# Patient Record
Sex: Male | Born: 1990 | Race: Black or African American | Hispanic: No | Marital: Single | Smoking: Current every day smoker
Health system: Southern US, Community
[De-identification: ages and names within clinical notes are randomized; demographics above are authoritative.]

---

## 2012-07-01 ENCOUNTER — Encounter (HOSPITAL_COMMUNITY): Payer: Self-pay | Admitting: *Deleted

## 2012-07-01 ENCOUNTER — Emergency Department (HOSPITAL_COMMUNITY)
Admission: EM | Admit: 2012-07-01 | Discharge: 2012-07-02 | Disposition: A | Discharge status: Home / Self Care | Payer: Self-pay | Attending: Emergency Medicine | Admitting: Emergency Medicine

## 2012-07-01 DIAGNOSIS — R6883 Chills (without fever): Secondary | ICD-10-CM | POA: Insufficient documentation

## 2012-07-01 DIAGNOSIS — F17200 Nicotine dependence, unspecified, uncomplicated: Secondary | ICD-10-CM | POA: Insufficient documentation

## 2012-07-01 DIAGNOSIS — K2970 Gastritis, unspecified, without bleeding: Secondary | ICD-10-CM | POA: Insufficient documentation

## 2012-07-01 DIAGNOSIS — R197 Diarrhea, unspecified: Secondary | ICD-10-CM | POA: Insufficient documentation

## 2012-07-01 DIAGNOSIS — R110 Nausea: Secondary | ICD-10-CM | POA: Insufficient documentation

## 2012-07-01 MED ORDER — ONDANSETRON 8 MG PO TBDP: 8.0000 mg | ORAL_TABLET | Freq: Once | ORAL | Status: DC

## 2012-07-01 MED ORDER — ONDANSETRON 8 MG PO TBDP: ORAL_TABLET | ORAL | Status: DC

## 2012-07-01 MED ORDER — ONDANSETRON HCL 4 MG/2ML IJ SOLN
4.0000 mg | Freq: Once | INTRAMUSCULAR | Status: AC
  Administered 2012-07-01: 4 mg via INTRAVENOUS
  Filled 2012-07-01: qty 2

## 2012-07-01 MED ORDER — SODIUM CHLORIDE 0.9 % IV BOLUS (SEPSIS)
1000.0000 mL | Freq: Once | INTRAVENOUS | Status: AC
  Administered 2012-07-01: 1000 mL via INTRAVENOUS

## 2012-07-01 NOTE — ED Notes (Signed)
Gave pt of fluid to drink.

## 2012-07-01 NOTE — ED Notes (Signed)
Per pt and friend - pt was drinking ETOH yesterday evening "was drunk but didn't drink that much" - pt also ate [employer] food last night. Pt awoke this a.m. Not feeling well - pt's friend gave him one of her rx flagyl and ibuprofen. Pt has continued to have generalized, intermittent abd pain, denies n/v however has had multiple soft stools. Pt attempted to drink a beer tonight and was unable to d/t not feeling well.

## 2012-07-01 NOTE — ED Provider Notes (Signed)
History     CSN: [id number]  Arrival date & time 07/01/12  2029   First MD Initiated Contact with Patient 07/01/12 2226      Chief Complaint  Patient presents with  . Abdominal Pain   HPI  History provided by the patient. Patient is a 21 year old male with no significant PMH who presents with complaints of diarrhea and abdominal discomfort. Patient states that he was out last night drinking with friends. He woke up this morning not feeling well with slight nausea and developed episodes of soft loose diarrhea without blood or mucus. Since that time he has had waxing waning abdominal discomfort and continued nausea. Patient has tolerated some fluids but has not eaten any solid foods today. He also took some ibuprofen and antibiotic from his friend without significant change in symptoms. He denies any fever but reports some chills. He has no recent travel. Denies any known sick contacts.    History reviewed. No pertinent past medical history.  No past surgical history on file.  No family history on file.  History  Substance Use Topics  . Smoking status: Current Every Day Smoker -- 0.5 packs/day    Types: Cigarettes  . Smokeless tobacco: Not on file  . Alcohol Use: Yes     Comment: drinks every weekend      Review of Systems  Constitutional: Positive for chills. Negative for fever.  HENT: Negative for congestion and rhinorrhea.   Gastrointestinal: Positive for nausea, abdominal pain and diarrhea. Negative for vomiting and constipation.  Genitourinary: Negative for dysuria, frequency, hematuria and flank pain.  Skin: Negative for rash.    Allergies  Review of patient's allergies indicates no known allergies.  Home Medications   Current Outpatient Rx  Name  Route  Sig  Dispense  Refill  . IBUPROFEN 200 MG PO TABS   Oral   Take 200 mg by mouth every 6 (six) hours as needed. For pain         . METRONIDAZOLE 500 MG PO TABS   Oral   Take 500 mg by mouth once.             BP 133/72  Pulse 60  Temp 99 F (37.2 C) (Oral)  Resp 20  SpO2 99%  Physical Exam  Nursing note and vitals reviewed. Constitutional: He is oriented to person, place, and time. He appears well-developed and well-nourished. No distress.  HENT:  Head: Normocephalic.  Mouth/Throat: Oropharynx is clear and moist.  Cardiovascular: Normal rate and regular rhythm.   Pulmonary/Chest: Effort normal and breath sounds normal. No respiratory distress. He has no wheezes. He has no rales.  Abdominal: Soft. He exhibits no distension. There is no rebound, no guarding, no CVA tenderness, no tenderness at McBurney's point and negative Murphy's sign.       Mild diffuse tenderness  Neurological: He is alert and oriented to person, place, and time.  Skin: Skin is warm and dry. No rash noted.  Psychiatric: He has a normal mood and affect. His behavior is normal.    ED Course  Procedures      1. Gastritis   2. Diarrhea       MDM  Seen and evaluated. Patient does not appear acutely ill or toxic. He is resting comfortably in bed. She has mild diffuse discomfort of the abdomen without peritoneal signs.   She reports feeling better after IV fluids and Zofran. He has tolerated by mouth fluids. At this time we'll discharge home.  Angus Seller, [place] 07/02/12 [phone]

## 2012-07-02 NOTE — ED Notes (Signed)
Patient tolerated PO intake without any nausea or vomiting.

## 2012-07-03 NOTE — ED Provider Notes (Signed)
Medical screening examination/treatment/procedure(s) were performed by non-physician practitioner and as supervising physician I was immediately available for consultation/collaboration.  Flint Melter, MD 07/03/12 [phone]

## 2013-05-01 ENCOUNTER — Encounter (HOSPITAL_COMMUNITY): Payer: Self-pay | Admitting: Emergency Medicine

## 2013-05-01 ENCOUNTER — Emergency Department (HOSPITAL_COMMUNITY)
Admission: EM | Admit: 2013-05-01 | Discharge: 2013-05-01 | Disposition: A | Discharge status: Home / Self Care | Payer: Self-pay | Attending: Emergency Medicine | Admitting: Emergency Medicine

## 2013-05-01 DIAGNOSIS — R05 Cough: Secondary | ICD-10-CM | POA: Insufficient documentation

## 2013-05-01 DIAGNOSIS — N342 Other urethritis: Secondary | ICD-10-CM | POA: Insufficient documentation

## 2013-05-01 DIAGNOSIS — F17200 Nicotine dependence, unspecified, uncomplicated: Secondary | ICD-10-CM | POA: Insufficient documentation

## 2013-05-01 DIAGNOSIS — R109 Unspecified abdominal pain: Secondary | ICD-10-CM | POA: Insufficient documentation

## 2013-05-01 DIAGNOSIS — R300 Dysuria: Secondary | ICD-10-CM | POA: Insufficient documentation

## 2013-05-01 DIAGNOSIS — R059 Cough, unspecified: Secondary | ICD-10-CM | POA: Insufficient documentation

## 2013-05-01 MED ORDER — AZITHROMYCIN 250 MG PO TABS
1000.0000 mg | ORAL_TABLET | Freq: Once | ORAL | Status: AC
  Administered 2013-05-01: 1000 mg via ORAL
  Filled 2013-05-01: qty 4

## 2013-05-01 MED ORDER — CEFTRIAXONE SODIUM 250 MG IJ SOLR
250.0000 mg | Freq: Once | INTRAMUSCULAR | Status: AC
  Administered 2013-05-01: 250 mg via INTRAMUSCULAR
  Filled 2013-05-01: qty 250

## 2013-05-01 NOTE — ED Notes (Signed)
Per pt, "was messing with a girl a couple of weeks ago" a few days later he woke up wigh abdominal pain and a "discharge"

## 2013-05-01 NOTE — ED Provider Notes (Signed)
Medical screening examination/treatment/procedure(s) were performed by non-physician practitioner and as supervising physician I was immediately available for consultation/collaboration.  Ethelda Chick, MD 05/01/13 1025

## 2013-05-01 NOTE — ED Provider Notes (Signed)
CSN: [id number]     Arrival date & time 05/01/13  6213 History   First MD Initiated Contact with Patient 05/01/13 [phone]     Chief Complaint  Patient presents with  . Exposure to STD   (Consider location/radiation/quality/duration/timing/severity/associated sxs/prior Treatment) HPI  Hunter Mcdonald is a 22 y.o. male complaining of complaining of lower abdominal discomfort starting 2 days ago associated with a cough white urethral discharge and dysuria starting yesterday. Patient denies rash, lesion, testicular pain or swelling, fever, nausea vomiting, change in bowel or bladder habits, joint pain. Patient has unprotected sex 2 weeks ago.   History reviewed. No pertinent past medical history. History reviewed. No pertinent past surgical history. No family history on file. History  Substance Use Topics  . Smoking status: Current Every Day Smoker -- 0.50 packs/day    Types: Cigarettes  . Smokeless tobacco: Not on file  . Alcohol Use: Yes     Comment: drinks every weekend    Review of Systems 10 systems reviewed and found to be negative, except as noted in the HPI  Allergies  Review of patient's allergies indicates no known allergies.  Home Medications   Current Outpatient Rx  Name  Route  Sig  Dispense  Refill  . ibuprofen (ADVIL,MOTRIN) 200 MG tablet   Oral   Take 200 mg by mouth every 6 (six) hours as needed. For pain         . metroNIDAZOLE (FLAGYL) 500 MG tablet   Oral   Take 500 mg by mouth once.         . ondansetron (ZOFRAN ODT) 8 MG disintegrating tablet      8mg  ODT q4 hours prn nausea   10 tablet   0    BP 134/73  Pulse 63  Temp(Src) 98.7 F (37.1 C) (Oral)  Resp 16  SpO2 100% Physical Exam  Nursing note and vitals reviewed. Constitutional: He is oriented to person, place, and time. He appears well-developed and well-nourished. No distress.  HENT:  Head: Normocephalic.  Eyes: Conjunctivae and EOM are normal.  Cardiovascular: Normal rate.    Pulmonary/Chest: Effort normal. No stridor.  Abdominal: Soft. There is no tenderness.  Genitourinary: Testes normal and penis normal. Right testis shows no swelling and no tenderness. Left testis shows no swelling and no tenderness. Circumcised. No penile erythema or penile tenderness. No discharge found.  No rashes or lesions  Musculoskeletal: Normal range of motion.  Lymphadenopathy:       Right: No inguinal adenopathy present.       Left: No inguinal adenopathy present.  Neurological: He is alert and oriented to person, place, and time.  Psychiatric: He has a normal mood and affect.    ED Course  Procedures (including critical care time) Labs Review Labs Reviewed - No data to display Imaging Review No results found.  MDM   1. Urethritis    Filed Vitals:   05/01/13 0901  BP: 134/73  Pulse: 63  Temp: 98.7 F (37.1 C)  TempSrc: Oral  Resp: 16  SpO2: 100%     Hunter Mcdonald is a 22 y.o. male with urethritis after unprotected sex several weeks ago. No systemic signs, abdominal exam is benign, testicular exam is normal and there were no rashes or lesions. I will treat the patient for gonorrhea and Chlamydia, and I have offered to test him for HIV and syphilis but he has refused. Counseled the patient on importance of full STD screening.  Medications  azithromycin (ZITHROMAX) tablet 1,000  mg (not administered)  cefTRIAXone (ROCEPHIN) injection 250 mg (not administered)    Pt is hemodynamically stable, appropriate for, and amenable to discharge at this time. Pt verbalized understanding and agrees with care plan. All questions answered. Outpatient follow-up and specific return precautions discussed.    Note: Portions of this report may have been transcribed using voice recognition software. Every effort was made to ensure accuracy; however, inadvertent computerized transcription errors may be present      Wynetta Emery, PA-C 05/01/13 1022

## 2013-05-01 NOTE — ED Notes (Signed)
Our PA, Joni Reining, is evaluating him as I write this.  She obtains urethral swab also. Pt. Is in no distress.

## 2013-05-03 LAB — GC/CHLAMYDIA PROBE AMP
CT Probe RNA: POSITIVE — AB
GC Probe RNA: NEGATIVE

## 2013-05-05 NOTE — ED Notes (Signed)
+   Chlamydia Patient treated with Rocephin And Zithromax-[facility] faxed 

## 2013-08-17 ENCOUNTER — Encounter (HOSPITAL_COMMUNITY): Payer: Self-pay | Admitting: Emergency Medicine

## 2013-08-17 ENCOUNTER — Emergency Department (HOSPITAL_COMMUNITY)
Admission: EM | Admit: 2013-08-17 | Discharge: 2013-08-17 | Disposition: A | Discharge status: Home / Self Care | Payer: Self-pay | Attending: Emergency Medicine | Admitting: Emergency Medicine

## 2013-08-17 DIAGNOSIS — B009 Herpesviral infection, unspecified: Secondary | ICD-10-CM | POA: Insufficient documentation

## 2013-08-17 DIAGNOSIS — N342 Other urethritis: Secondary | ICD-10-CM | POA: Insufficient documentation

## 2013-08-17 DIAGNOSIS — A64 Unspecified sexually transmitted disease: Secondary | ICD-10-CM | POA: Insufficient documentation

## 2013-08-17 DIAGNOSIS — F17200 Nicotine dependence, unspecified, uncomplicated: Secondary | ICD-10-CM | POA: Insufficient documentation

## 2013-08-17 LAB — RPR: RPR Ser Ql: NONREACTIVE

## 2013-08-17 LAB — GC/CHLAMYDIA PROBE AMP
CT Probe RNA: NEGATIVE
GC PROBE AMP APTIMA: NEGATIVE

## 2013-08-17 MED ORDER — STERILE WATER FOR INJECTION IJ SOLN
INTRAMUSCULAR | Status: AC
  Filled 2013-08-17: qty 10

## 2013-08-17 MED ORDER — ACYCLOVIR 400 MG PO TABS: 800.0000 mg | ORAL_TABLET | Freq: Two times a day (BID) | ORAL | Status: DC

## 2013-08-17 MED ORDER — AZITHROMYCIN 250 MG PO TABS
1000.0000 mg | ORAL_TABLET | Freq: Once | ORAL | Status: AC
  Administered 2013-08-17: 1000 mg via ORAL
  Filled 2013-08-17: qty 4

## 2013-08-17 MED ORDER — CEFTRIAXONE SODIUM 250 MG IJ SOLR
250.0000 mg | Freq: Once | INTRAMUSCULAR | Status: AC
  Administered 2013-08-17: 250 mg via INTRAMUSCULAR
  Filled 2013-08-17: qty 250

## 2013-08-17 MED ORDER — ACYCLOVIR 200 MG PO CAPS
400.0000 mg | ORAL_CAPSULE | Freq: Once | ORAL | Status: AC
  Administered 2013-08-17: 400 mg via ORAL
  Filled 2013-08-17: qty 2

## 2013-08-17 NOTE — ED Notes (Signed)
Pt also asked for a herpes test, he has a small patch of bumps on the shaft of his penis

## 2013-08-17 NOTE — ED Provider Notes (Signed)
CSN: [id number]     Arrival date & time 08/17/13  0240 History   First MD Initiated Contact with Patient 08/17/13 0400     Chief Complaint  Patient presents with  . Penile Discharge   (Consider location/radiation/quality/duration/timing/severity/associated sxs/prior Treatment) HPI  This is a 23 year old male who presents with penile discharge. Patient reports 2 weeks of clear penile discharge. He also reports lesion over the right side of his penis. Patient has multiple sexual partners and has had new sexual partners as well. He has history of STDs but is unsure what kind. He denies any abdominal pain or groin swelling. He denies any other symptoms at this time including dysuria.  History reviewed. No pertinent past medical history. History reviewed. No pertinent past surgical history. History reviewed. No pertinent family history. History  Substance Use Topics  . Smoking status: Current Every Day Smoker -- 0.50 packs/day    Types: Cigarettes  . Smokeless tobacco: Not on file  . Alcohol Use: Yes     Comment: drinks every weekend    Review of Systems  Constitutional: Negative.  Negative for fever.  Respiratory: Negative.  Negative for shortness of breath.   Cardiovascular: Negative.  Negative for chest pain.  Gastrointestinal: Negative.  Negative for abdominal pain.  Genitourinary: Positive for discharge. Negative for dysuria.  Neurological: Negative for headaches.  All other systems reviewed and are negative.    Allergies  Review of patient's allergies indicates no known allergies.  Home Medications   Current Outpatient Rx  Name  Route  Sig  Dispense  Refill  . acyclovir (ZOVIRAX) 400 MG tablet   Oral   Take 2 tablets (800 mg total) by mouth 2 (two) times daily.   20 tablet   0    BP 115/74  Pulse 66  Temp(Src) 98.2 F (36.8 C) (Oral)  Resp 20  Ht 5\' 8"  (1.727 m)  Wt 170 lb (77.111 kg)  BMI 25.85 kg/m2  SpO2 98% Physical Exam  Nursing note and vitals  reviewed. Constitutional: He is oriented to person, place, and time. He appears well-developed and well-nourished. No distress.  HENT:  Head: Normocephalic and atraumatic.  Cardiovascular: Normal rate and regular rhythm.   Pulmonary/Chest: Effort normal. No respiratory distress.  Genitourinary: Penis normal. No penile tenderness.  Normal pallus, small less than 1 cm area of the right shaft with multiple punctate vesicles, no ulceration noted, no inguinal swelling, no spontaneous drainage noted from the urethra  Musculoskeletal: He exhibits no edema.  Lymphadenopathy:    He has no cervical adenopathy.  Neurological: He is alert and oriented to person, place, and time.  Skin: Skin is warm and dry.  Psychiatric: He has a normal mood and affect.    ED Course  Procedures (including critical care time) Labs Review Labs Reviewed  GC/CHLAMYDIA PROBE AMP  HERPES SIMPLEX VIRUS CULTURE  RPR   Imaging Review No results found.  EKG Interpretation   None       MDM   1. STD (sexually transmitted disease)   2. Herpes   3. Urethritis    Patient presents with concerns for penile discharge. He is nontoxic-appearing on exam. He has multiple risk factors for STDs. Exam is notable for vesicular lesion over the right side of the penile shaft. No other lesions noted. Lesion suspicious for herpes. Patient was swabbed for GC and Chlamydia as well as herpes. He will be given Rocephin, azithromycin, and acyclovir.  He will be discharged home with a five-day course of  acyclovir. He was instructed to avoid sexual contact for 10 days and inform partners of his empiric treatment for STDs. Patient stated understanding.  After history, exam, and medical workup I feel the patient has been appropriately medically screened and is safe for discharge home. Pertinent diagnoses were discussed with the patient. Patient was given return precautions.     Shon Batonourtney F Horton, MD 08/17/13 [phone]

## 2013-08-17 NOTE — Discharge Instructions (Signed)
Genital Herpes Genital herpes is a sexually transmitted disease. This means that it is a disease passed by having sex with an infected person. There is no cure for genital herpes. The time between attacks can be months to years. The virus may live in a person but produce no problems (symptoms). This infection can be passed to a baby as it travels down the birth canal (vagina). In a newborn, this can cause central nervous system damage, eye damage, or even death. The virus that causes genital herpes is usually HSV-2 virus. The virus that causes oral herpes is usually HSV-1. The diagnosis (learning what is wrong) is made through culture results. SYMPTOMS  Usually symptoms of pain and itching begin a few days to a week after contact. It first appears as small blisters that progress to small painful ulcers which then scab over and heal after several days. It affects the outer genitalia, birth canal, cervix, penis, anal area, buttocks, and thighs. HOME CARE INSTRUCTIONS   Keep ulcerated areas dry and clean.  Take medications as directed. Antiviral medications can speed up healing. They will not prevent recurrences or cure this infection. These medications can also be taken for suppression if there are frequent recurrences.  While the infection is active, it is contagious. Avoid all sexual contact during active infections.  Condoms may help prevent spread of the herpes virus.  Practice safe sex.  Wash your hands thoroughly after touching the genital area.  Avoid touching your eyes after touching your genital area.  Inform your caregiver if you have had genital herpes and become pregnant. It is your responsibility to insure a safe outcome for your baby in this pregnancy.  Only take over-the-counter or prescription medicines for pain, discomfort, or fever as directed by your caregiver. SEEK MEDICAL CARE IF:   You have a recurrence of this infection.  You do not respond to medications and are not  improving.  You have new sources of pain or discharge which have changed from the original infection.  You have an oral temperature above 102 F (38.9 C).  You develop abdominal pain.  You develop eye pain or signs of eye infection. Document Released: 07/25/2000 Document Revised: 10/20/2011 Document Reviewed: 08/15/2009 [facility] Patient Information 2014 [place], [place]. Urethritis, Adult Urethritis is an inflammation (soreness) of the urethra (the tube exiting from the bladder). It is often caused by germs that may be spread through sexual contact. TREATMENT  Urethritis will usually respond to antibiotics. These are medications that kill germs. Take all the medicine given to you. You may feel better in a couple days, but TAKE ALL MEDICINE or the infection may not be completely cured and may become more difficult to treat. Response can generally be expected in 7 to 10 days. You may require additional treatment after more testing. HOME CARE INSTRUCTIONS  Not have sex until the test results are known and treatment is completed.  Know that you may be asked to notify your sex partner when your final test results are back.  Finish all medications as prescribed.  Prevent sexually transmitted infections including AIDS. Practice safe sex. Use condoms. SEEK MEDICAL CARE IF:   Your symptoms are not improved in 2 to 3 days.  Your symptoms are getting worse.  Your develop abdominal pain.  You develop joint pain. SEEK IMMEDIATE MEDICAL CARE IF:   You have a fever.  You develop severe pain in the belly, back or side.  You develop repeated vomiting. TEST RESULTS Not all test results  are available during your visit. If your test results are not back during the visit, make an appointment with your caregiver to find out the results. Do not assume everything is normal if you have not heard from your caregiver or the medical facility. It is important for you to follow-up on all of your test  results. Document Released: 01/21/2001 Document Revised: 10/20/2011 Document Reviewed: 08/13/2009 [facility] Patient Information 2014 [place]

## 2013-08-17 NOTE — ED Notes (Signed)
Pt complains of penile discharge for two weeks, last had intercourse last week

## 2013-08-18 LAB — HERPES SIMPLEX VIRUS CULTURE: Culture: NOT DETECTED

## 2013-11-14 ENCOUNTER — Encounter (HOSPITAL_COMMUNITY): Payer: Self-pay | Admitting: Emergency Medicine

## 2013-11-14 ENCOUNTER — Emergency Department (HOSPITAL_COMMUNITY)
Admission: EM | Admit: 2013-11-14 | Discharge: 2013-11-14 | Disposition: A | Discharge status: Home / Self Care | Payer: Self-pay | Attending: Emergency Medicine | Admitting: Emergency Medicine

## 2013-11-14 DIAGNOSIS — N342 Other urethritis: Secondary | ICD-10-CM | POA: Insufficient documentation

## 2013-11-14 DIAGNOSIS — R300 Dysuria: Secondary | ICD-10-CM | POA: Insufficient documentation

## 2013-11-14 DIAGNOSIS — F17200 Nicotine dependence, unspecified, uncomplicated: Secondary | ICD-10-CM | POA: Insufficient documentation

## 2013-11-14 LAB — URINALYSIS, ROUTINE W REFLEX MICROSCOPIC
Bilirubin Urine: NEGATIVE
Glucose, UA: NEGATIVE mg/dL
HGB URINE DIPSTICK: NEGATIVE
Ketones, ur: NEGATIVE mg/dL
Nitrite: NEGATIVE
PROTEIN: NEGATIVE mg/dL
Specific Gravity, Urine: 1.011 (ref 1.005–1.030)
UROBILINOGEN UA: 0.2 mg/dL (ref 0.0–1.0)
pH: 5 (ref 5.0–8.0)

## 2013-11-14 LAB — URINE MICROSCOPIC-ADD ON

## 2013-11-14 MED ORDER — CEFTRIAXONE SODIUM 250 MG IJ SOLR
250.0000 mg | Freq: Once | INTRAMUSCULAR | Status: AC
  Administered 2013-11-14: 250 mg via INTRAMUSCULAR
  Filled 2013-11-14: qty 250

## 2013-11-14 MED ORDER — AZITHROMYCIN 250 MG PO TABS
1000.0000 mg | ORAL_TABLET | Freq: Once | ORAL | Status: AC
  Administered 2013-11-14: 1000 mg via ORAL
  Filled 2013-11-14: qty 4

## 2013-11-14 NOTE — ED Provider Notes (Signed)
CSN: [id number]     Arrival date & time 11/14/13  1225 History   This chart was scribed for non-physician practitioner Jeannetta Ellis, PA-C working with Richardean Canal, MD by Donne Anon, ED Scribe. This patient was seen in room WTR7/WTR7 and the patient's care was started at 1303.  First MD Initiated Contact with Patient 11/14/13 1303     Chief Complaint  Patient presents with  . Penile Discharge    The history is provided by the patient. No language interpreter was used.   HPI Comments: Erland Vivas is a 23 y.o. male who presents to the Emergency Department complaining of 1 week of gradual onset, gradually worsening penile discharge with associated mild dysuria. He states the discharge is a creamy, off white and greenish color. He has had unprotected sex with an unknown partner. He denies fever, chills, testicular swelling, increased frequency, nausea, vomiting or any other symptoms.  He currently smokes .5 pack of cigarettes/day.   History reviewed. No pertinent past medical history. History reviewed. No pertinent past surgical history. No family history on file. History  Substance Use Topics  . Smoking status: Current Every Day Smoker -- 0.50 packs/day    Types: Cigarettes  . Smokeless tobacco: Not on file  . Alcohol Use: Yes     Comment: drinks every weekend    Review of Systems  Constitutional: Negative for fever and chills.  Gastrointestinal: Negative for nausea and vomiting.  Genitourinary: Positive for dysuria and discharge. Negative for urgency, frequency, penile swelling, scrotal swelling, penile pain and testicular pain.  All other systems reviewed and are negative.      Allergies  Review of patient's allergies indicates no known allergies.  Home Medications   No current outpatient prescriptions on file. BP 130/66  Pulse 58  Temp(Src) 98.2 F (36.8 C) (Oral)  SpO2 100%  Physical Exam  Nursing note and vitals reviewed. Constitutional: He is  oriented to person, place, and time. He appears well-developed and well-nourished. No distress.  HENT:  Head: Normocephalic and atraumatic.  Right Ear: External ear normal.  Left Ear: External ear normal.  Nose: Nose normal.  Mouth/Throat: Oropharynx is clear and moist.  Eyes: Conjunctivae are normal.  Neck: Normal range of motion. Neck supple.  Cardiovascular: Normal rate, regular rhythm and normal heart sounds.   Pulmonary/Chest: Effort normal and breath sounds normal. No respiratory distress.  Abdominal: Soft.  Genitourinary: Testes normal and penis normal. Right testis shows no mass, no swelling and no tenderness. Right testis is descended. Left testis shows no mass, no swelling and no tenderness. Left testis is descended. Circumcised. No penile tenderness. No discharge found.  Musculoskeletal: Normal range of motion.  Neurological: He is alert and oriented to person, place, and time.  Skin: Skin is warm and dry. He is not diaphoretic.  Psychiatric: He has a normal mood and affect.    ED Course  Procedures (including critical care time) DIAGNOSTIC STUDIES: Oxygen Saturation is 100% on RA, normal by my interpretation.    COORDINATION OF CARE: 1:30 PM Discussed treatment plan which includes Rocephin 250 mg injection, Zithromax 1000 mg tablet, GC/Chlamydia probe amp, urine culture and urinalysis with pt at bedside and pt agreed to plan.    Labs Review Labs Reviewed  URINALYSIS, ROUTINE W REFLEX MICROSCOPIC - Abnormal; Notable for the following:    Leukocytes, UA SMALL (*)    All other components within normal limits  URINE CULTURE  GC/CHLAMYDIA PROBE AMP  URINE MICROSCOPIC-ADD ON   Imaging  Review No results found.   EKG Interpretation None      MDM   Final diagnoses:  Urethritis    Filed Vitals:   11/14/13 1240  BP: 130/66  Pulse: 58  Temp: 98.2 F (36.8 C)   Afebrile, NAD, non-toxic appearing, AAOx4.  Robyn HaberDevonte Rosevear is a 23 y.o. male with urethritis after  unprotected sex several weeks ago. No systemic signs, abdominal exam is benign, testicular exam is normal and there were no rashes or lesions. I will treat the patient for gonorrhea and Chlamydia. Counseled the patient on importance of full STD screening.  I personally performed the services described in this documentation, which was scribed in my presence. The recorded information has been reviewed and is accurate.     Jeannetta EllisJennifer L Marcio Hoque, PA-C 11/14/13 2017

## 2013-11-14 NOTE — Progress Notes (Signed)
[facility] provided pt with a list of primary care resources to help patient establish primary care.  °

## 2013-11-14 NOTE — Discharge Instructions (Signed)
Please follow up with your primary care physician in 1-2 days. If you do not have one please call the [facility] number listed above. Please read all discharge instructions and return precautions.    Sexually Transmitted Disease A sexually transmitted disease (STD) is a disease or infection that may be passed (transmitted) from person to person, usually during sexual activity. This may happen by way of saliva, semen, blood, vaginal mucus, or urine. Common STDs include:   Gonorrhea.   Chlamydia.   Syphilis.   HIV and AIDS.   Genital herpes.   Hepatitis B and C.   Trichomonas.   Human papillomavirus (HPV).   Pubic lice.   Scabies.  Mites.  Bacterial vaginosis. WHAT ARE CAUSES OF STDs? An STD may be caused by bacteria, a virus, or parasites. STDs are often transmitted during sexual activity if one person is infected. However, they may also be transmitted through nonsexual means. STDs may be transmitted after:   Sexual intercourse with an infected person.   Sharing sex toys with an infected person.   Sharing needles with an infected person or using unclean piercing or tattoo needles.  Having intimate contact with the genitals, mouth, or rectal areas of an infected person.   Exposure to infected fluids during birth. WHAT ARE THE SIGNS AND SYMPTOMS OF STDs? Different STDs have different symptoms. Some people may not have any symptoms. If symptoms are present, they may include:   Painful or bloody urination.   Pain in the pelvis, abdomen, vagina, anus, throat, or eyes.   Skin rash, itching, irritation, growths, sores (lesions), ulcerations, or warts in the genital or anal area.  Abnormal vaginal discharge with or without bad odor.   Penile discharge in men.   Fever.   Pain or bleeding during sexual intercourse.   Swollen glands in the groin area.   Yellow skin and eyes (jaundice). This is seen with hepatitis.   Swollen  testicles.  Infertility.  Sores and blisters in the mouth. HOW ARE STDs DIAGNOSED? To make a diagnosis, your health care provider may:   Take a medical history.   Perform a physical exam.   Take a sample of any discharge for examination.  Swab the throat, cervix, opening to the penis, rectum, or vagina for testing.  Test a sample of your first morning urine.   Perform blood tests.   Perform a Pap smear, if this applies.   Perform a colposcopy.   Perform a laparoscopy.  HOW ARE STDs TREATED? Treatment depends on the STD. Some STDs may be treated but not cured.   Chlamydia, gonorrhea, trichomonas, and syphilis can be cured with antibiotics.   Genital herpes, hepatitis, and HIV can be treated, but not cured, with prescribed medicines. The medicines lessen symptoms.   Genital warts from HPV can be treated with medicine or by freezing, burning (electrocautery), or surgery. Warts may come back.   HPV cannot be cured with medicine or surgery. However, abnormal areas may be removed from the cervix, vagina, or vulva.   If your diagnosis is confirmed, your recent sexual partners need treatment. This is true even if they are symptom-free or have a negative culture or evaluation. They should not have sex until their health care providers say it is OK. HOW CAN I REDUCE MY RISK OF GETTING AN STD?  Use latex condoms, dental dams, and water-soluble lubricants during sexual activity. Do not use petroleum jelly or oils.  Get vaccinated for HPV and hepatitis. If  you have not received these vaccines in the past, talk to your health care provider about whether one or both might be right for you.   Avoid risky sex practices that can break the skin.  WHAT SHOULD I DO IF I THINK I HAVE AN STD?  See your health care provider.   Inform all sexual partners. They should be tested and treated for any STDs.  Do not have sex until your health care provider says it is OK. WHEN  SHOULD I GET HELP? Seek immediate medical care if:  You develop severe abdominal pain.  You are a man and notice swelling or pain in the testicles.  You are a woman and notice swelling or pain in your vagina. Document Released: 10/18/2002 Document Revised: 05/18/2013 Document Reviewed: 02/15/2013 [facility] Patient Information 2014 [place], [place].  Urethritis, Adult Urethritis is an inflammation of the tube through which urine exits your bladder (urethra).  CAUSES Urethritis is often caused by an infection in your urethra. The infection can be viral, like herpes. The infection can also be bacterial, like gonorrhea. RISK FACTORS Risk factors of urethritis include:  Having sex without using a condom.  Having multiple sexual partners.  Having poor hygiene. SIGNS AND SYMPTOMS Symptoms of urethritis are less noticeable in women than in men. These symptoms include:  Burning feeling when you urinate (dysuria).  Discharge from your urethra.  Blood in your urine (hematuria).  Urinating more than usual. DIAGNOSIS  To confirm a diagnosis of urethritis, your health care provider will do the following:  Ask about your sexual history.  Perform a physical exam.  Have you provide a sample of your urine for lab testing.  Use a cotton swab to gently collect a sample from your urethra for lab testing. TREATMENT  It is important to treat urethritis. Depending on the cause, untreated urethritis may lead to serious genital infections and possibly infertility. Urethritis caused by a bacterial infection is treated with antibiotics. All sexual partners must be treated.  HOME CARE INSTRUCTIONS  Do not have sex until the test results are known and treatment is completed, even if your symptoms go away before you finish treatment.  Finish all medicines that you are prescribed. SEEK MEDICAL CARE IF:   Your symptoms are not improved in 3 days.  Your symptoms are getting worse.  You develop  abdominal pain or pelvic pain (in women).  You develop joint pain. SEEK IMMEDIATE MEDICAL CARE IF:   You have a fever with a temperature of 101.60F (38.8C) or greater.  You have severe pain in the belly, back, or side.  You have repeated vomiting. Document Released: 01/21/2001 Document Revised: 05/18/2013 Document Reviewed: 03/28/2013 [facility] Patient Information 2014 [place], [place].

## 2013-11-14 NOTE — ED Notes (Signed)
Pt c/o penile discharge x 1 week, states its a creamy, off white and greenish color. Has slight pain.

## 2013-11-15 LAB — URINE CULTURE
Colony Count: NO GROWTH
Culture: NO GROWTH

## 2013-11-15 LAB — GC/CHLAMYDIA PROBE AMP
CT Probe RNA: NEGATIVE
GC PROBE AMP APTIMA: NEGATIVE

## 2013-11-17 NOTE — ED Provider Notes (Signed)
Medical screening examination/treatment/procedure(s) were performed by non-physician practitioner and as supervising physician I was immediately available for consultation/collaboration.   EKG Interpretation None        Jazmon Kos H Syndi Pua, MD 11/17/13 0807 

## 2014-01-07 ENCOUNTER — Encounter (HOSPITAL_COMMUNITY): Payer: Self-pay | Admitting: Emergency Medicine

## 2014-01-07 ENCOUNTER — Emergency Department (HOSPITAL_COMMUNITY): Payer: No Typology Code available for payment source

## 2014-01-07 ENCOUNTER — Emergency Department (HOSPITAL_COMMUNITY)
Admission: EM | Admit: 2014-01-07 | Discharge: 2014-01-07 | Disposition: A | Discharge status: Home / Self Care | Payer: No Typology Code available for payment source | Attending: Emergency Medicine | Admitting: Emergency Medicine

## 2014-01-07 DIAGNOSIS — F17200 Nicotine dependence, unspecified, uncomplicated: Secondary | ICD-10-CM | POA: Insufficient documentation

## 2014-01-07 DIAGNOSIS — S0993XA Unspecified injury of face, initial encounter: Secondary | ICD-10-CM | POA: Insufficient documentation

## 2014-01-07 DIAGNOSIS — IMO0002 Reserved for concepts with insufficient information to code with codable children: Secondary | ICD-10-CM | POA: Insufficient documentation

## 2014-01-07 DIAGNOSIS — S199XXA Unspecified injury of neck, initial encounter: Secondary | ICD-10-CM

## 2014-01-07 DIAGNOSIS — Y929 Unspecified place or not applicable: Secondary | ICD-10-CM | POA: Insufficient documentation

## 2014-01-07 DIAGNOSIS — Y9389 Activity, other specified: Secondary | ICD-10-CM | POA: Insufficient documentation

## 2014-01-07 DIAGNOSIS — S51009A Unspecified open wound of unspecified elbow, initial encounter: Secondary | ICD-10-CM | POA: Insufficient documentation

## 2014-01-07 MED ORDER — IBUPROFEN 600 MG PO TABS: 600.0000 mg | ORAL_TABLET | Freq: Three times a day (TID) | ORAL | Status: AC
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
## 2014-01-07 NOTE — ED Notes (Signed)
Pt resting quietly with eyes closed at the time. No signs of distress noted.
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
## 2015-06-29 ENCOUNTER — Encounter (HOSPITAL_COMMUNITY): Payer: Self-pay

## 2015-06-29 ENCOUNTER — Emergency Department (HOSPITAL_COMMUNITY)
Admission: EM | Admit: 2015-06-29 | Discharge: 2015-06-29 | Disposition: A | Discharge status: Home / Self Care | Payer: Self-pay | Attending: Emergency Medicine | Admitting: Emergency Medicine

## 2015-06-29 DIAGNOSIS — R369 Urethral discharge, unspecified: Secondary | ICD-10-CM | POA: Insufficient documentation

## 2015-06-29 DIAGNOSIS — N489 Disorder of penis, unspecified: Secondary | ICD-10-CM

## 2015-06-29 DIAGNOSIS — F17210 Nicotine dependence, cigarettes, uncomplicated: Secondary | ICD-10-CM | POA: Insufficient documentation

## 2015-06-29 DIAGNOSIS — N5089 Other specified disorders of the male genital organs: Secondary | ICD-10-CM | POA: Insufficient documentation

## 2015-06-29 MED ORDER — PENICILLIN G BENZATHINE 1200000 UNIT/2ML IM SUSP
1.2000 10*6.[IU] | Freq: Once | INTRAMUSCULAR | Status: AC
  Administered 2015-06-29: 1.2 10*6.[IU] via INTRAMUSCULAR
  Filled 2015-06-29: qty 2

## 2015-06-29 NOTE — ED Notes (Signed)
Bed: WA27 Expected date:  Expected time:  Means of arrival:  Comments: 

## 2015-06-29 NOTE — ED Notes (Signed)
Pt evaluated by PA. Specimen sent. Pt waiting for his RPR and then will be for discharge. Pt denies any penile discharge. No rash on the palms or feet.

## 2015-06-29 NOTE — ED Notes (Signed)
Pt presents for a STD check, due to "bumps" on penis x 2.5 weeks.  Pain score 4/10.  Denies dysuria and penile discharge.  Pt reports new sexual partner prior to "bumps" appearing.

## 2015-06-29 NOTE — Discharge Instructions (Signed)
Sexually Transmitted Disease °A sexually transmitted disease (STD) is a disease or infection that may be passed (transmitted) from person to person, usually during sexual activity. This may happen by way of saliva, semen, blood, vaginal mucus, or urine. Common STDs include: °· Gonorrhea. °· Chlamydia. °· Syphilis. °· HIV and AIDS. °· Genital herpes. °· Hepatitis B and C. °· Trichomonas. °· Human papillomavirus (HPV). °· Pubic lice. °· Scabies. °· Mites. °· Bacterial vaginosis. °WHAT ARE CAUSES OF STDs? °An STD may be caused by bacteria, a virus, or parasites. STDs are often transmitted during sexual activity if one person is infected. However, they may also be transmitted through nonsexual means. STDs may be transmitted after:  °· Sexual intercourse with an infected person. °· Sharing sex toys with an infected person. °· Sharing needles with an infected person or using unclean piercing or tattoo needles. °· Having intimate contact with the genitals, mouth, or rectal areas of an infected person. °· Exposure to infected fluids during birth. °WHAT ARE THE SIGNS AND SYMPTOMS OF STDs? °Different STDs have different symptoms. Some people may not have any symptoms. If symptoms are present, they may include: °· Painful or bloody urination. °· Pain in the pelvis, abdomen, vagina, anus, throat, or eyes. °· A skin rash, itching, or irritation. °· Growths, ulcerations, blisters, or sores in the genital and anal areas. °· Abnormal vaginal discharge with or without bad odor. °· Penile discharge in men. °· Fever. °· Pain or bleeding during sexual intercourse. °· Swollen glands in the groin area. °· Yellow skin and eyes (jaundice). This is seen with hepatitis. °· Swollen testicles. °· Infertility. °· Sores and blisters in the mouth. °HOW ARE STDs DIAGNOSED? °To make a diagnosis, your health care provider may: °· Take a medical history. °· Perform a physical exam. °· Take a sample of any discharge to examine. °· Swab the throat,  cervix, opening to the penis, rectum, or vagina for testing. °· Test a sample of your first morning urine. °· Perform blood tests. °· Perform a Pap test, if this applies. °· Perform a colposcopy. °· Perform a laparoscopy. °HOW ARE STDs TREATED? °Treatment depends on the STD. Some STDs may be treated but not cured. °· Chlamydia, gonorrhea, trichomonas, and syphilis can be cured with antibiotic medicine. °· Genital herpes, hepatitis, and HIV can be treated, but not cured, with prescribed medicines. The medicines lessen symptoms. °· Genital warts from HPV can be treated with medicine or by freezing, burning (electrocautery), or surgery. Warts may come back. °· HPV cannot be cured with medicine or surgery. However, abnormal areas may be removed from the cervix, vagina, or vulva. °· If your diagnosis is confirmed, your recent sexual partners need treatment. This is true even if they are symptom-free or have a negative culture or evaluation. They should not have sex until their health care providers say it is okay. °· Your health care provider may test you for infection again 3 months after treatment. °HOW CAN I REDUCE MY RISK OF GETTING AN STD? °Take these steps to reduce your risk of getting an STD: °· Use latex condoms, dental dams, and water-soluble lubricants during sexual activity. Do not use petroleum jelly or oils. °· Avoid having multiple sex partners. °· Do not have sex with someone who has other sex partners °· Do not have sex with anyone you do not know or who is at high risk for an STD. °· Avoid risky sex practices that can break your skin. °· Do not have sex   if you have open sores on your mouth or skin. °· Avoid drinking too much alcohol or taking illegal drugs. Alcohol and drugs can affect your judgment and put you in a vulnerable position. °· Avoid engaging in oral and anal sex acts. °· Get vaccinated for HPV and hepatitis. If you have not received these vaccines in the past, talk to your health care  provider about whether one or both might be right for you. °· If you are at risk of being infected with HIV, it is recommended that you take a prescription medicine daily to prevent HIV infection. This is called pre-exposure prophylaxis (PrEP). You are considered at risk if: °¨ You are a man who has sex with other men (MSM). °¨ You are a heterosexual man or woman and are sexually active with more than one partner. °¨ You take drugs by injection. °¨ You are sexually active with a partner who has HIV. °· Talk with your health care provider about whether you are at high risk of being infected with HIV. If you choose to begin PrEP, you should first be tested for HIV. You should then be tested every 3 months for as long as you are taking PrEP. °WHAT SHOULD I DO IF I THINK I HAVE AN STD? °· See your health care provider. °· Tell your sexual partner(s). They should be tested and treated for any STDs. °· Do not have sex until your health care provider says it is okay. °WHEN SHOULD I GET IMMEDIATE MEDICAL CARE? °Contact your health care provider right away if:  °· You have severe abdominal pain. °· You are a man and notice swelling or pain in your testicles. °· You are a woman and notice swelling or pain in your vagina. °  °This information is not intended to replace advice given to you by your health care provider. Make sure you discuss any questions you have with your health care provider. °  °Document Released: 10/18/2002 Document Revised: 08/18/2014 Document Reviewed: 02/15/2013 °Elsevier Interactive Patient Education ©2016 [employer] ° °

## 2015-06-29 NOTE — ED Provider Notes (Signed)
CSN: [id number]     Arrival date & time 06/29/15  [id number] History   None    Chief Complaint  Patient presents with  . STD Check      (Consider location/radiation/quality/duration/timing/severity/associated sxs/prior Treatment) Patient is a 24 y.o. male presenting with penile discharge. The history is provided by the patient. No language interpreter was used.  Penile Discharge This is a new problem. The current episode started today. The problem occurs constantly. The problem has been unchanged. Nothing aggravates the symptoms. He has tried nothing for the symptoms.  Pt reports he had a lesion on his penis about 3 weeks ago.   Pt reports new area started yesterday  History reviewed. No pertinent past medical history. History reviewed. No pertinent past surgical history. History reviewed. No pertinent family history. Social History  Substance Use Topics  . Smoking status: Current Every Day Smoker -- 0.50 packs/day    Types: Cigarettes  . Smokeless tobacco: None  . Alcohol Use: Yes     Comment: occ    Review of Systems  Genitourinary: Positive for discharge.  All other systems reviewed and are negative.     Allergies  Review of patient's allergies indicates no known allergies.  Home Medications   Prior to Admission medications   Not on File   BP 129/72 mmHg  Pulse 50  Temp(Src) 98 F (36.7 C) (Oral)  Resp 12  SpO2 99% Physical Exam  Constitutional: He is oriented to person, place, and time. He appears well-developed and well-nourished.  HENT:  Head: Normocephalic.  Eyes: EOM are normal.  Neck: Normal range of motion.  Pulmonary/Chest: Effort normal.  Abdominal: He exhibits no distension.  Genitourinary: Penis normal.  Dried lesion,  Small pimple distal.  Musculoskeletal: Normal range of motion.  Neurological: He is alert and oriented to person, place, and time.  Psychiatric: He has a normal mood and affect.  Nursing note and vitals reviewed.   ED Course   Procedures (including critical care time) Labs Review Labs Reviewed  RPR  GC[facility]CHLAMYDIA PROBE AMP ([place]    Imaging Review No results found. I have personally reviewed and evaluated these images and lab results as part of my medical decision-making.   EKG Interpretation None      MDM I am concerned about syphillis.   Pt given injection of pcn.   Gc/ct and rpr pending   Final diagnoses:  Penile lesion    Std information    Elson AreasLeslie K Holley Kocurek, PA-C 06/29/15 1132  Devoria AlbeIva Knapp, MD 07/02/15 [phone]

## 2015-06-30 LAB — RPR: RPR Ser Ql: NONREACTIVE

## 2015-07-02 LAB — GC/CHLAMYDIA PROBE AMP (~~LOC~~) NOT AT ARMC
Chlamydia: NEGATIVE
Neisseria Gonorrhea: NEGATIVE

## 2016-01-08 ENCOUNTER — Encounter (HOSPITAL_COMMUNITY): Payer: Self-pay | Admitting: Emergency Medicine

## 2016-01-08 DIAGNOSIS — F17210 Nicotine dependence, cigarettes, uncomplicated: Secondary | ICD-10-CM | POA: Insufficient documentation

## 2016-01-08 DIAGNOSIS — K0889 Other specified disorders of teeth and supporting structures: Secondary | ICD-10-CM | POA: Insufficient documentation

## 2016-01-08 DIAGNOSIS — K029 Dental caries, unspecified: Secondary | ICD-10-CM | POA: Insufficient documentation

## 2016-01-08 NOTE — ED Notes (Signed)
Pt c/o dental pain in left lower back tooth for several days st's pain has gotton worse over past 3 days

## 2016-01-09 ENCOUNTER — Emergency Department (HOSPITAL_COMMUNITY)
Admission: EM | Admit: 2016-01-09 | Discharge: 2016-01-09 | Disposition: A | Discharge status: Home / Self Care | Payer: No Typology Code available for payment source | Attending: Emergency Medicine | Admitting: Emergency Medicine

## 2016-01-09 DIAGNOSIS — K0889 Other specified disorders of teeth and supporting structures: Secondary | ICD-10-CM

## 2016-01-09 MED ORDER — NAPROXEN 500 MG PO TABS: 500.0000 mg | ORAL_TABLET | Freq: Two times a day (BID) | ORAL | Status: AC

## 2016-01-09 MED ORDER — PENICILLIN V POTASSIUM 500 MG PO TABS: 500.0000 mg | ORAL_TABLET | Freq: Four times a day (QID) | ORAL | Status: AC

## 2016-01-09 MED ORDER — BUPIVACAINE-EPINEPHRINE (PF) 0.5% -1:200000 IJ SOLN
1.8000 mL | Freq: Once | INTRAMUSCULAR | Status: AC
  Administered 2016-01-09: 1.8 mL

## 2016-01-09 NOTE — ED Provider Notes (Signed)
CSN: [id number]     Arrival date & time 01/08/16  2226 History   First MD Initiated Contact with Patient 01/09/16 0010     Chief Complaint  Patient presents with  . Dental Pain     (Consider location/radiation/quality/duration/timing/severity/associated sxs/prior Treatment) Patient is a 25 y.o. male presenting with tooth pain. The history is provided by the patient. No language interpreter was used.  Dental Pain Location:  Lower Lower teeth location:  30/RL 1st molar Quality:  Aching, throbbing and sharp Severity:  Moderate Onset quality:  Gradual Duration:  1 week Timing:  Intermittent Progression:  Waxing and waning Chronicity:  New Context: dental caries and poor dentition   Relieved by: "meds gave every 12 hours in jail" Worsened by:  Touching and pressure Ineffective treatments:  None tried Associated symptoms: facial pain and facial swelling   Associated symptoms: no difficulty swallowing, no drooling, no fever, no oral bleeding, no oral lesions and no trismus   Risk factors: lack of dental care and smoking     History reviewed. No pertinent past medical history. History reviewed. No pertinent past surgical history. No family history on file. Social History  Substance Use Topics  . Smoking status: Current Every Day Smoker -- 0.50 packs/day    Types: Cigarettes  . Smokeless tobacco: None  . Alcohol Use: Yes     Comment: occ    Review of Systems  Constitutional: Negative for fever.  HENT: Positive for dental problem and facial swelling. Negative for drooling and mouth sores.   All other systems reviewed and are negative.   Allergies  Review of patient's allergies indicates no known allergies.  Home Medications   Prior to Admission medications   Medication Sig Start Date End Date Taking? Authorizing Provider  naproxen (NAPROSYN) 500 MG tablet Take 1 tablet (500 mg total) by mouth 2 (two) times daily. 01/09/16   Antony Madura, PA-C  penicillin v potassium  (VEETID) 500 MG tablet Take 1 tablet (500 mg total) by mouth 4 (four) times daily. 01/09/16 01/16/16  Antony Madura, PA-C   BP 130/69 mmHg  Pulse 52  Temp(Src) 98.6 F (37 C) (Oral)  Resp 16  Ht  (1.727 m)  Wt 74.163 kg  BMI 24.87 kg/m2  SpO2 100%   Physical Exam  Constitutional: He is oriented to person, place, and time. He appears well-developed and well-nourished. No distress.  HENT:  Head: Normocephalic and atraumatic.  Mouth/Throat: Uvula is midline, oropharynx is clear and moist and mucous membranes are normal. No trismus in the jaw. Dental caries present. No dental abscesses.    Extensive dental decay. No gingival swelling or fluctuance. No trismus. Patient tolerating secretions without difficulty.  Eyes: Conjunctivae and EOM are normal. No scleral icterus.  Neck: Normal range of motion.  No nuchal rigidity or meningismus  Pulmonary/Chest: Effort normal. No respiratory distress.  Musculoskeletal: Normal range of motion.  Neurological: He is alert and oriented to person, place, and time.  Skin: Skin is warm and dry. No rash noted. He is not diaphoretic. No erythema. No pallor.  Psychiatric: He has a normal mood and affect. His behavior is normal.  Nursing note and vitals reviewed.   ED Course  Dental Date/Time: 01/09/2016 12:24 AM Performed by: Antony Madura Authorized by: Antony Madura Consent: The procedure was performed in an emergent situation. Verbal consent obtained. Written consent not obtained. Risks and benefits: risks, benefits and alternatives were discussed Consent given by: patient Patient understanding: patient states understanding of the procedure being  performed Patient consent: the patient's understanding of the procedure matches consent given Procedure consent: procedure consent matches procedure scheduled Relevant documents: relevant documents present and verified Test results: test results available and properly labeled Site marked: the operative  site was marked Imaging studies: imaging studies available Required items: required blood products, implants, devices, and special equipment available Patient identity confirmed: verbally with patient and arm band Time out: Immediately prior to procedure a "time out" was called to verify the correct patient, procedure, equipment, support staff and site/side marked as required. Preparation: Patient was prepped and draped in the usual sterile fashion. Local anesthesia used: yes Anesthesia: nerve block Local anesthetic: bupivacaine 0.5% with epinephrine Anesthetic total: 1.8 ml Patient sedated: no Patient tolerance: Patient tolerated the procedure well with no immediate complications   (including critical care time) Labs Review Labs Reviewed - No data to display  Imaging Review No results found.   I have personally reviewed and evaluated these images and lab results as part of my medical decision-making.   EKG Interpretation None      MDM   Final diagnoses:  Dentalgia    Patient with toothache. No gross abscess. Exam unconcerning for Ludwig's angina or spread of infection. Will treat with penicillin and pain medicine. Urged patient to follow-up with dentist. Referral and resource guide provided. Patient discharged in good condition with no unaddressed concerns.   Filed Vitals:   01/08/16 2308  BP: 130/69  Pulse: 52  Temp: 98.6 F (37 C)  Resp: [address]16     Aliahna Statzer, PA-C 01/09/16 0052  Azalia BilisKevin Campos, MD 01/09/16 [phone]

## 2016-01-09 NOTE — Discharge Instructions (Signed)
Dental Pain °Dental pain may be caused by many things, including: °· Tooth decay (cavities or caries). Cavities expose the nerve of your tooth to air and hot or cold temperatures. This can cause pain or discomfort. °· Abscess or infection. A dental abscess is a collection of infected pus from a bacterial infection in the inner part of the tooth (pulp). It usually occurs at the end of the tooth's root. °· Injury. °· An unknown reason (idiopathic). °Your pain may be mild or severe. It may only occur when: °· You are chewing. °· You are exposed to hot or cold temperature. °· You are eating or drinking sugary foods or beverages, such as soda or candy. °Your pain may also be constant. °HOME CARE INSTRUCTIONS °Watch your dental pain for any changes. The following actions may help to lessen any discomfort that you are feeling: °· Take medicines only as directed by your dentist. °· If you were prescribed an antibiotic medicine, finish all of it even if you start to feel better. °· Keep all follow-up visits as directed by your dentist. This is important. °· Do not apply heat to the outside of your face. °· Rinse your mouth or gargle with salt water if directed by your dentist. This helps with pain and swelling. °¨ You can make salt water by adding ¼ tsp of salt to 1 cup of warm water. °· Apply ice to the painful area of your face: °¨ Put ice in a plastic bag. °¨ Place a towel between your skin and the bag. °¨ Leave the ice on for 20 minutes, 2-3 times per day. °· Avoid foods or drinks that cause you pain, such as: °¨ Very hot or very cold foods or drinks. °¨ Sweet or sugary foods or drinks. °SEEK MEDICAL CARE IF: °· Your pain is not controlled with medicines. °· Your symptoms are worse. °· You have new symptoms. °SEEK IMMEDIATE MEDICAL CARE IF: °· You are unable to open your mouth. °· You are having trouble breathing or swallowing. °· You have a fever. °· Your face, neck, or jaw is swollen. °  °This information is not  intended to replace advice given to you by your health care provider. Make sure you discuss any questions you have with your health care provider. °  °Document Released: 07/28/2005 Document Revised: 12/12/2014 Document Reviewed: 07/24/2014 °Elsevier Interactive Patient Education ©2016 [employer] ° °Community Resource Guide Dental °The [employer]’s “211” is a great source of information about community services available.  Access by dialing 2-1-1 from anywhere in [place], or by website -  www.nc211.org.  ° °Other Local Resources (Updated 08/2015) ° °Dental  Care °  °Services ° °  °Phone Number and Address  °Cost  °[facility] For children 0 - 21 years of age:  °• Cleaning °• Tooth brushing/flossing instruction °• Sealants, fillings, crowns °• Extractions °• Emergency treatment  [phone] °[address] °[place], [place] [address] Charges based on family income.  Medicaid and some insurance plans accepted.   °  °[facility] [place] • Cleaning °• Sealants, fillings, crowns °• Extractions °• Emergency treatment [phone] °[address] °[place], [place] ° Pregnant women 18 years of age or older with a Medicaid card  °[facility] [place] • Cleaning °• Sealants, fillings, crowns °• Extractions °• Emergency treatment [phone] °[address] °[place], [place] Pregnant women 18 years of age or older with a   Medicaid card  °[facility] For children 0 - 21 years of age:  °• Cleaning °• Tooth brushing/flossing instruction °• Sealants, fillings, crowns °• Extractions °• Emergency treatment °Limited orthodontic services for patients with Medicaid [phone] °[address] °[place], [place] [address] Medicaid and [place] Health Choice cover for children up to age 21 and pregnant women.  Parents of children up to age 21 without Medicaid pay a  reduced fee at time of service.  °[facility] [place] For children 0 - 21 years of age:  °• Cleaning °• Tooth brushing/flossing instruction °• Sealants, fillings, crowns °• Extractions °• Emergency treatment °Limited orthodontic services for patients with Medicaid [phone] °[address] °[place], [place].  Medicaid and [place] Health Choice cover for children up to age 21 and pregnant women.  Parents of children up to age 21 without Medicaid pay a reduced fee.  °[facility] • Cleaning °• Sealants, fillings, crowns °• Extractions ° °Hours: Tuesdays and Thursdays, 4:15 - 8 pm [phone] °[address] °[place], [place] [address] Services free of charge to  County residents ages 18-64 who do not have health insurance, Medicare, Medicaid, or VA benefits and fall within federal poverty guidelines  °[facility] ° ° ° Provides dental care in addition to primary medical care, nutritional counseling, and pharmacy: °• Cleaning °• Sealants, fillings, crowns °• Extractions ° ° ° ° ° ° ° ° ° ° ° ° ° ° ° ° ° [phone] °[facility], [address] °[place], [place] ° °[phone] °[facility], [address] [place], [place] ° °[phone] °[facility] °[place], [place] ° °[phone] °[facility], [address] °[place], [place] ° °[phone] °[facility] °[address] °[place], [place] Accepts Medicaid, Medicare, most insurance.  Also provides services available to all with fees adjusted based on ability to pay.    °[facility] • Cleaning °• Tooth brushing/flossing instruction °• Sealants, fillings, crowns °• Extractions °• Emergency treatment °Hours: Tuesdays, Thursdays, and Fridays from 8 am to 5 pm by appointment only. [phone] °[address] °[place], [place]  [address] [place] residents with Medicaid (depending on eligibility) and children with [place] Health Choice - call for more information.  °[facility] • Extractions only ° °Hours: 2nd and 4th Thursday of each month from 6:30 am - 9 am.   [phone]. [phone] °[address] °[place], [place] [address] Ages 18 and older only.  Patients are seen on a first come, first served basis.  °[facility] • Cleanings °• Fillings °• Extractions °• Orthodontics °• Endodontics °• Implants/Crowns/Bridges °• Complete and partial dentures [phone] °[place],  Patients must complete an application for services.  There is often a waiting list.   ° °
